# Patient Record
Sex: Male | Born: 1980 | Hispanic: No | Marital: Married | Smoking: Never smoker
Health system: Southern US, Community
[De-identification: ages and names within clinical notes are randomized; demographics above are authoritative.]

---

## 2017-10-23 ENCOUNTER — Emergency Department
Admission: EM | Admit: 2017-10-23 | Discharge: 2017-10-23 | Disposition: A | Discharge status: Home / Self Care | Payer: Self-pay | Attending: Emergency Medicine | Admitting: Emergency Medicine

## 2017-10-23 ENCOUNTER — Emergency Department: Payer: Self-pay

## 2017-10-23 DIAGNOSIS — N23 Unspecified renal colic: Secondary | ICD-10-CM

## 2017-10-23 DIAGNOSIS — N200 Calculus of kidney: Secondary | ICD-10-CM

## 2017-10-23 DIAGNOSIS — R109 Unspecified abdominal pain: Secondary | ICD-10-CM

## 2017-10-23 DIAGNOSIS — N132 Hydronephrosis with renal and ureteral calculous obstruction: Secondary | ICD-10-CM | POA: Insufficient documentation

## 2017-10-23 LAB — URINALYSIS, COMPLETE (UACMP) WITH MICROSCOPIC
BACTERIA UA: NONE SEEN
Bilirubin Urine: NEGATIVE
GLUCOSE, UA: NEGATIVE mg/dL
KETONES UR: NEGATIVE mg/dL
Leukocytes, UA: NEGATIVE
Nitrite: NEGATIVE
PROTEIN: 30 mg/dL — AB
Specific Gravity, Urine: 1.019 (ref 1.005–1.030)
pH: 6 (ref 5.0–8.0)

## 2017-10-23 LAB — COMPREHENSIVE METABOLIC PANEL
ALT: 12 U/L — AB (ref 17–63)
AST: 23 U/L (ref 15–41)
Albumin: 4.4 g/dL (ref 3.5–5.0)
Alkaline Phosphatase: 89 U/L (ref 38–126)
Anion gap: 8 (ref 5–15)
BUN: 12 mg/dL (ref 6–20)
CO2: 27 mmol/L (ref 22–32)
Calcium: 9.1 mg/dL (ref 8.9–10.3)
Chloride: 102 mmol/L (ref 101–111)
Creatinine, Ser: 1.38 mg/dL — ABNORMAL HIGH (ref 0.61–1.24)
GLUCOSE: 110 mg/dL — AB (ref 65–99)
Potassium: 3.5 mmol/L (ref 3.5–5.1)
Sodium: 137 mmol/L (ref 135–145)
Total Bilirubin: 0.7 mg/dL (ref 0.3–1.2)
Total Protein: 7.9 g/dL (ref 6.5–8.1)

## 2017-10-23 LAB — CBC
HEMATOCRIT: 47.1 % (ref 40.0–52.0)
HEMOGLOBIN: 16.1 g/dL (ref 13.0–18.0)
MCH: 28.7 pg (ref 26.0–34.0)
MCHC: 34.2 g/dL (ref 32.0–36.0)
MCV: 83.8 fL (ref 80.0–100.0)
Platelets: 192 10*3/uL (ref 150–440)
RBC: 5.62 MIL/uL (ref 4.40–5.90)
RDW: 14.3 % (ref 11.5–14.5)
WBC: 4.5 10*3/uL (ref 3.8–10.6)

## 2017-10-23 MED ORDER — ONDANSETRON HCL 4 MG/2ML IJ SOLN
4.0000 mg | Freq: Once | INTRAMUSCULAR | Status: AC
  Administered 2017-10-23: 4 mg via INTRAVENOUS

## 2017-10-23 MED ORDER — ONDANSETRON HCL 4 MG/2ML IJ SOLN
INTRAMUSCULAR | Status: AC
  Administered 2017-10-23: 4 mg via INTRAVENOUS
  Filled 2017-10-23: qty 2

## 2017-10-23 MED ORDER — ONDANSETRON 4 MG PO TBDP: ORAL_TABLET | ORAL | 0 refills | Status: AC

## 2017-10-23 MED ORDER — TAMSULOSIN HCL 0.4 MG PO CAPS: ORAL_CAPSULE | ORAL | 0 refills | Status: AC

## 2017-10-23 MED ORDER — FENTANYL CITRATE (PF) 100 MCG/2ML IJ SOLN
50.0000 ug | INTRAMUSCULAR | Status: DC | PRN
  Administered 2017-10-23: 50 ug via NASAL

## 2017-10-23 MED ORDER — KETOROLAC TROMETHAMINE 30 MG/ML IJ SOLN
15.0000 mg | Freq: Once | INTRAMUSCULAR | Status: AC
Start: 2017-10-23 — End: 2017-10-23
  Administered 2017-10-23: 15 mg via INTRAVENOUS
  Filled 2017-10-23: qty 1

## 2017-10-23 MED ORDER — MORPHINE SULFATE (PF) 4 MG/ML IV SOLN
INTRAVENOUS | Status: AC
  Administered 2017-10-23: 4 mg via INTRAVENOUS
  Filled 2017-10-23: qty 1

## 2017-10-23 MED ORDER — OXYCODONE-ACETAMINOPHEN 5-325 MG PO TABS
2.0000 | ORAL_TABLET | Freq: Once | ORAL | Status: AC
  Administered 2017-10-23: 2 via ORAL
  Filled 2017-10-23: qty 2

## 2017-10-23 MED ORDER — FENTANYL CITRATE (PF) 100 MCG/2ML IJ SOLN
INTRAMUSCULAR | Status: AC
  Filled 2017-10-23: qty 2

## 2017-10-23 MED ORDER — DOCUSATE SODIUM 100 MG PO CAPS: ORAL_CAPSULE | ORAL | 0 refills | Status: AC

## 2017-10-23 MED ORDER — MORPHINE SULFATE (PF) 4 MG/ML IV SOLN
4.0000 mg | Freq: Once | INTRAVENOUS | Status: AC
Start: 2017-10-23 — End: 2017-10-23
  Administered 2017-10-23: 4 mg via INTRAVENOUS

## 2017-10-23 MED ORDER — HYDROCODONE-ACETAMINOPHEN 5-325 MG PO TABS: 1.0000 | ORAL_TABLET | Freq: Four times a day (QID) | ORAL | 0 refills | Status: AC | PRN

## 2017-10-23 NOTE — ED Notes (Signed)
Pt discharged to home.  Pt taking uber to discharge.  Discharge instructions reviewed.  Verbalized understanding.  No questions or concerns at this time.  Teach back verified.  Pt in NAD.  No items left in ED.

## 2017-10-23 NOTE — ED Triage Notes (Signed)
Patient c/o sudden onset of left flank pain. Patient denies any injury. Patient denies hx of the same. Patient denies urinary changes.

## 2017-10-23 NOTE — ED Notes (Signed)
Spoke with Dr. York Cerise regarding pt's pain and vomiting due to pain.  VO given for morphine  IV and zofran  IV.

## 2017-10-23 NOTE — ED Notes (Signed)
Spoke with Dr Lamont Snowball regarding pt's presenting s/x's and c/o's; VORB for CT Renal to be entered by this RN.

## 2017-10-23 NOTE — Discharge Instructions (Signed)
You have been seen in the Emergency Department (ED) today for pain that we believe based on your workup, is caused by kidney stones.  As we have discussed, please drink plenty of fluids.  Please make a follow up appointment with the physician(s) listed elsewhere in this documentation. ° °You may take pain medication as needed but ONLY as prescribed.  Please also take your prescribed Flomax daily.  We also recommend that you take over-the-counter ibuprofen regularly according to label instructions over the next 5 days.  Take it with meals to minimize stomach discomfort. ° °Please see your doctor as soon as possible as stones may take 1-3 weeks to pass and you may require additional care or medications. ° °Do not drink alcohol, drive or participate in any other potentially dangerous activities while taking opiate pain medication as it may make you sleepy. Do not take this medication with any other sedating medications, either prescription or over-the-counter. If you were prescribed Percocet or Vicodin, do not take these with acetaminophen (Tylenol) as it is already contained within these medications. °  °Take Norco as needed for severe pain.  This medication is an opiate (or narcotic) pain medication and can be habit forming.  Use it as little as possible to achieve adequate pain control.  Do not use or use it with extreme caution if you have a history of opiate abuse or dependence.  If you are on a pain contract with your primary care doctor or a pain specialist, be sure to let them know you were prescribed this medication today from the [facility].  This medication is intended for your use only - do not give any to anyone else and keep it in a secure place where nobody else, especially children, have access to it.  It will also cause or worsen constipation, so you may want to consider taking an over-the-counter stool softener while you are taking this medication. ° °Return to the  Emergency Department (ED) or call your doctor if you have any worsening pain, fever, painful urination, are unable to urinate, or develop other symptoms that concern you. ° °

## 2017-10-23 NOTE — ED Provider Notes (Signed)
[facility] Emergency Department Provider Note  ____________________________________________   First MD Initiated Contact with Patient 10/23/17 [phone]     (approximate)  I have reviewed the triage vital signs and the nursing notes.   HISTORY  Chief Complaint Flank Pain    HPI Fred Rivera is a 37 y.o. 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  He presents tonight by private vehicle for evaluation of acute onset left flank pain with some associated nausea.  He describes the pain is sharp and stabbing and it started acutely about 24 hours or more from my evaluation of him.  He says that he was asleep at a truck stop when he woke up due to the pain.  It has been waxing and waning and occasionally will almost completely go away but then will come back very strong.  It is currently severe and nothing makes it better or worse.  He has vomited once and had some persistent nausea.  It is located in his left flank and is nonradiating.  He denies dysuria and hematuria.  He denies fever/chills, chest pain, shortness of breath, and anterior abdominal pain.  He does not have a history of kidney stones.  History reviewed. No pertinent past medical history.  There are no active problems to display for this patient.   History reviewed. No pertinent surgical history.  Prior to Admission medications   Medication Sig Start Date End Date Taking? Authorizing Provider  docusate sodium (COLACE) 100 MG capsule Take 1 tablet once or twice daily as needed for constipation while taking narcotic pain medicine 10/23/17   Loleta Rose, MD  HYDROcodone-acetaminophen (NORCO/VICODIN) 5-325 MG tablet Take 1-2 tablets by mouth every 6 (six) hours as needed for moderate pain. 10/23/17   Loleta Rose, MD  ondansetron (ZOFRAN ODT) 4 MG disintegrating tablet Allow 1-2 tablets to dissolve in your mouth every 8 hours as needed for nausea/vomiting  10/23/17   Loleta Rose, MD  tamsulosin [facility]) 0.4 MG CAPS capsule Take 1 tablet by mouth daily until you pass the kidney stone or no longer have symptoms 10/23/17   Loleta Rose, MD    Allergies Patient has no known allergies.  No family history on file.  Social History Social History   Tobacco Use  . Smoking status: Never Smoker  . Smokeless tobacco: Never Used  Substance Use Topics  . Alcohol use: Yes  . Drug use: Never    Review of Systems Constitutional: No fever/chills Eyes: No visual changes. ENT: No sore throat. Cardiovascular: Denies chest pain. Respiratory: Denies shortness of breath. Gastrointestinal: Left flank pain but no abdominal pain.  Nausea and one episode of vomiting.  No diarrhea.  No constipation. Genitourinary: Negative for dysuria, no gross hematuria Musculoskeletal: Negative for neck pain.  Left flank pain Integumentary: Negative for rash. Neurological: Negative for headaches, focal weakness or numbness.   ____________________________________________   PHYSICAL EXAM:  VITAL SIGNS: ED Triage Vitals [10/23/17 0120]  Enc Vitals Group     BP (!) 151/97     Pulse Rate 81     Resp 18     Temp 97.8 F (36.6 C)     Temp Source Oral     SpO2 100 %     Weight 84.4 kg (186 lb)     Height 1.676 m ( )     Head Circumference      Peak Flow      Pain  Score 10     Pain Loc      Pain Edu?      Excl. in GC?     Constitutional: Alert and oriented. Well appearing and in no acute distress but does appear uncomfortable Eyes: Conjunctivae are normal.  Head: Atraumatic. Nose: No congestion/rhinnorhea. Mouth/Throat: Mucous membranes are moist. Neck: No stridor.  No meningeal signs.   Cardiovascular: Normal rate, regular rhythm. Good peripheral circulation. Grossly normal heart sounds. Respiratory: Normal respiratory effort.  No retractions. Lungs CTAB. Gastrointestinal: Soft and nontender. No distention.  Musculoskeletal: Left CVA tenderness to  percussion.  No lower extremity tenderness nor edema. No gross deformities of extremities. Neurologic:  Normal speech and language. No gross focal neurologic deficits are appreciated.  Skin:  Skin is warm, dry and intact. No rash noted. Psychiatric: Mood and affect are normal. Speech and behavior are normal.  ____________________________________________   LABS (all labs ordered are listed, but only abnormal results are displayed)  Labs Reviewed  URINALYSIS, COMPLETE (UACMP) WITH MICROSCOPIC - Abnormal; Notable for the following components:      Result Value   Color, Urine YELLOW (*)    APPearance HAZY (*)    Hgb urine dipstick LARGE (*)    Protein, ur 30 (*)    RBC / HPF >50 (*)    All other components within normal limits  COMPREHENSIVE METABOLIC PANEL - Abnormal; Notable for the following components:   Glucose, Bld 110 (*)    Creatinine, Ser 1.38 (*)    ALT 12 (*)    All other components within normal limits  CBC   ____________________________________________  EKG  None - EKG not ordered by ED physician ____________________________________________  RADIOLOGY   ED MD interpretation: Radiologist reports that the patient has a 4 mm obstructing stone in the left distal ureter with some minimal hydronephrosis and some more intrarenal stones.  He also has some nonspecific hypodensities within the liver.  Official radiology report(s): Ct Renal Stone Study  Result Date: 10/23/2017 CLINICAL DATA:  Acute onset of left flank pain. EXAM: CT ABDOMEN AND PELVIS WITHOUT CONTRAST TECHNIQUE: Multidetector CT imaging of the abdomen and pelvis was performed following the standard protocol without IV contrast. COMPARISON:  None. FINDINGS: Lower chest: The visualized lung bases are grossly clear. The visualized portions of the mediastinum are unremarkable. Hepatobiliary: Nonspecific hypodensities within the liver measure up to 1.5 cm in size. The liver is otherwise unremarkable. The gallbladder  is grossly unremarkable in appearance. The common bile duct remains normal in caliber. Pancreas: The pancreas is within normal limits. Spleen: The spleen is unremarkable in appearance. Adrenals/Urinary Tract: The adrenal glands are unremarkable in appearance. There is a 4 mm stone at the distal left ureter, 9 cm above the left vesicoureteral junction. Minimal left-sided hydronephrosis is noted. Nonobstructing left renal stones measure up to 6 mm in size. The right kidney is unremarkable. No significant perinephric stranding is seen. Stomach/Bowel: The stomach is unremarkable in appearance. The small bowel is within normal limits. The appendix is normal in caliber, without evidence of appendicitis. The colon is unremarkable in appearance. Vascular/Lymphatic: The abdominal aorta is unremarkable in appearance. The inferior vena cava is grossly unremarkable. No retroperitoneal lymphadenopathy is seen. No pelvic sidewall lymphadenopathy is identified. Reproductive: The bladder is decompressed and not well assessed. The prostate remains normal in size. Other: No additional soft tissue abnormalities are seen. Musculoskeletal: No acute osseous abnormalities are identified. The visualized musculature is unremarkable in appearance. IMPRESSION: 1. 4 mm obstructing stone at the  distal left ureter, 9 cm above the left vesicoureteral junction. Minimal left-sided hydronephrosis noted. 2. Nonobstructing left renal stones measure up to 6 mm in size. 3. Nonspecific hypodensities within the liver measure up to 1.5 cm in size. Would correlate with LFTs, and consider further imaging if deemed clinically appropriate. Electronically Signed   By: Roanna Raider M.D.   On: 10/23/2017 02:06    ____________________________________________   PROCEDURES  Critical Care performed: No   Procedure(s) performed:   Procedures   ____________________________________________   INITIAL IMPRESSION / ASSESSMENT AND PLAN / ED COURSE  As  part of my medical decision making, I reviewed the following data within the electronic MEDICAL RECORD NUMBER Nursing notes reviewed and incorporated, Labs reviewed  and [place] Controlled Substance Database    Differential diagnosis includes, but is not limited to, ureterolithiasis/renal colic, pyelonephritis/UTI, musculoskeletal pain, less likely aortic dissection or renal ischemia.  The patient's lab work is generally reassuring; he has a slightly elevated creatinine but no prior values against which to compare and he is getting 1 L of normal saline.  His metabolic panel including LFTs is reassuring.  Normal CBC.  He does have some RBCs in his urine but no sign of infection and no bacteria.  CT renal stone protocol indicates a 4 mm stone in the left ureter with some mild hydronephrosis which is all consistent with his clinical picture.  He received morphine 4 mg IV and Zofran 4 mg IV which slightly improved his symptoms.  I will give Toradol 15 mg IV which will likely be quite effective with the renal colic as well as 2 Percocet tablets by mouth.  I will then reassess whether he would benefit from lidocaine.  I had my usual customary management recommendations and return precautions discussion with him, including the fact that he should not be using narcotics while he is driving back home to [place].  He states that he understands and agrees with the plan.  Clinical Course as of Oct 23 456  Thu Oct 23, 2017  0441 I reviewed the patient's prescription history over the last 24 months in the multi-state controlled substances database(s) that includes [place], [place], [place], [place], [place], [place], [place], [place], [place], [place], [place], [place], [place], [place], and [place].  The patient has filled no controlled substances during that time.    [CF]  0442 Patient's pain is well controlled and only mild.  Will discharge as per previously discussed plan.  I gave my  usual and customary return precautions.    [CF]    Clinical Course User Index [CF] Loleta Rose, MD    ____________________________________________  FINAL CLINICAL IMPRESSION(S) / ED DIAGNOSES  Final diagnoses:  Left flank pain  Ureteral colic  Kidney stone     MEDICATIONS GIVEN DURING THIS VISIT:  Medications  fentaNYL (SUBLIMAZE) injection 50 mcg (50 mcg Nasal Given 10/23/17 0129)  morphine 4 MG/ML injection 4 mg (4 mg Intravenous Given 10/23/17 0232)  ondansetron (ZOFRAN) injection 4 mg (4 mg Intravenous Given 10/23/17 0232)  ketorolac (TORADOL) 30 MG/ML injection 15 mg (15 mg Intravenous Given 10/23/17 0411)  oxyCODONE-acetaminophen (PERCOCET/ROXICET) 5-325 MG per tablet 2 tablet (2 tablets Oral Given 10/23/17 0411)     ED Discharge Orders        Ordered    HYDROcodone-acetaminophen (NORCO/VICODIN) 5-325 MG tablet  Every 6 hours PRN     10/23/17 0457    ondansetron (ZOFRAN ODT) 4 MG disintegrating tablet     10/23/17  0457    tamsulosin (FLOMAX) 0.4 MG CAPS capsule     10/23/17 0457    docusate sodium (COLACE) 100 MG capsule     10/23/17 0457       Note:  This document was prepared using Dragon voice recognition software and may include unintentional dictation errors.    Loleta Rose, MD 10/23/17 [phone]

## 2018-06-09 IMAGING — CT CT RENAL STONE PROTOCOL
2 of 4 series · 15 of 46 positions shown, 17 images · non-contrast
Comparison: None.

CLINICAL DATA: Acute onset of left flank pain.

EXAM:
CT ABDOMEN AND PELVIS WITHOUT CONTRAST
TECHNIQUE: Multidetector CT imaging of the abdomen and pelvis was performed
following the standard protocol without IV contrast.

[Series 2: stone full standard · axial · 0.70mm/px · z∈[-857,-417]mm · 12 of 97 slices shown, 14 images]
[im 5/97  soft-tissue]
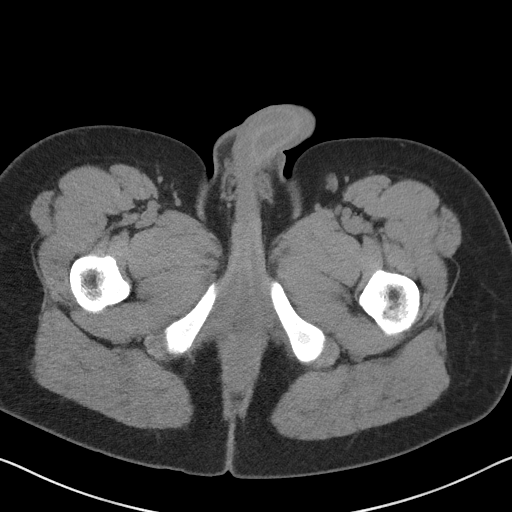
[im 5/97  bone]
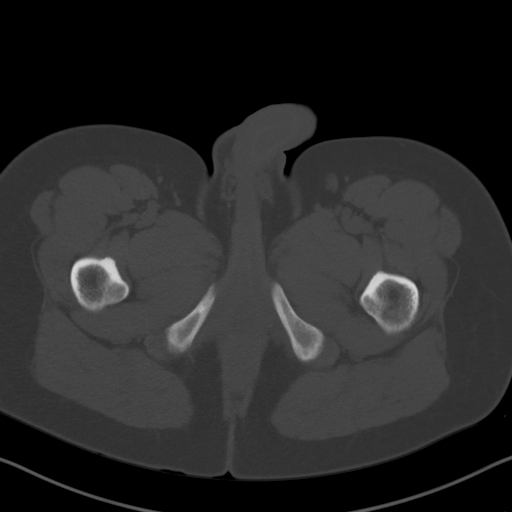
[im 13/97  soft-tissue]
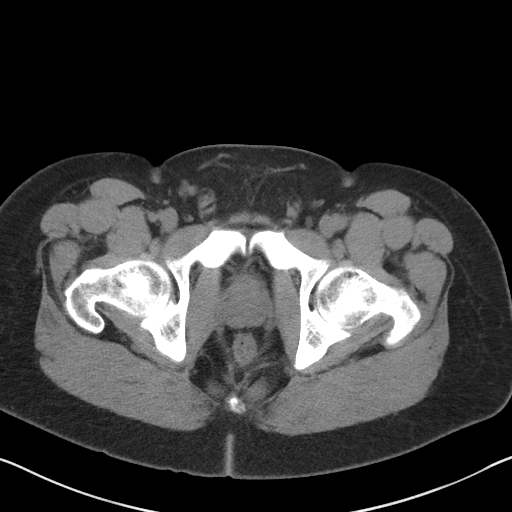
[im 21/97  soft-tissue]
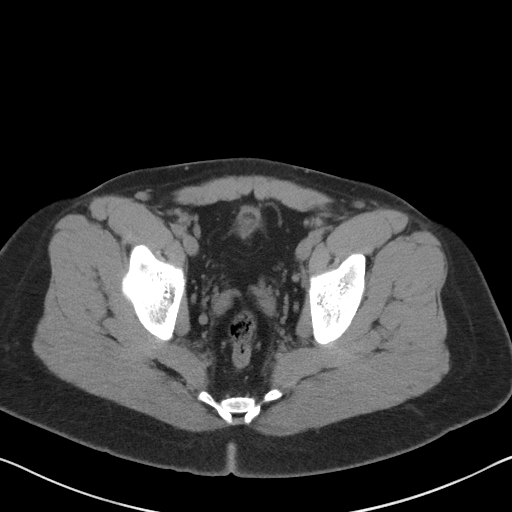
[im 29/97  soft-tissue]
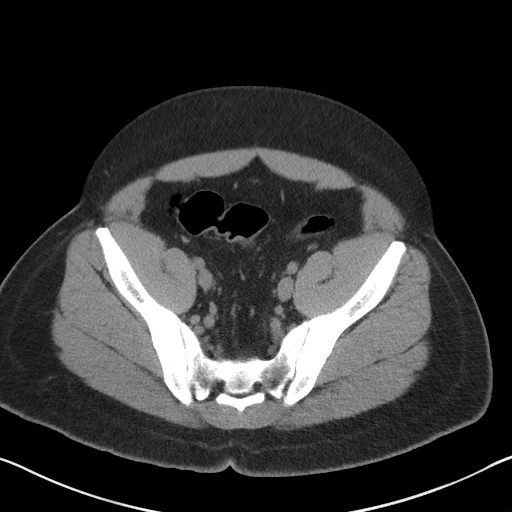
[im 37/97  soft-tissue]
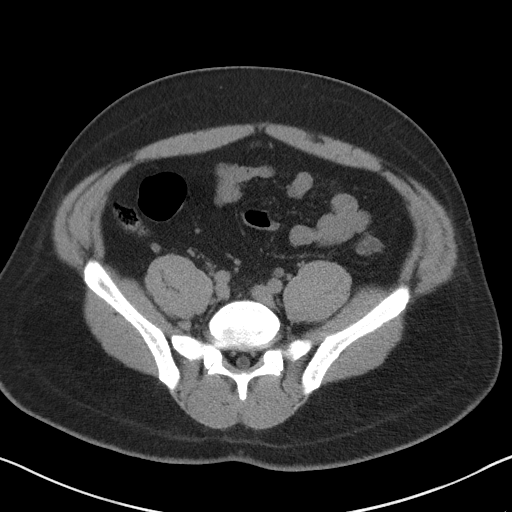
[im 45/97  soft-tissue]
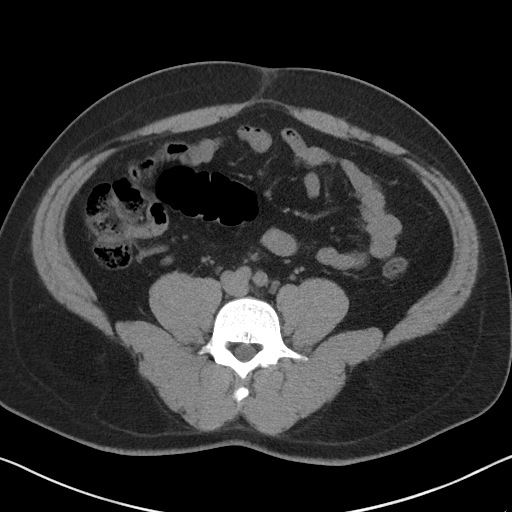
[im 53/97  soft-tissue]
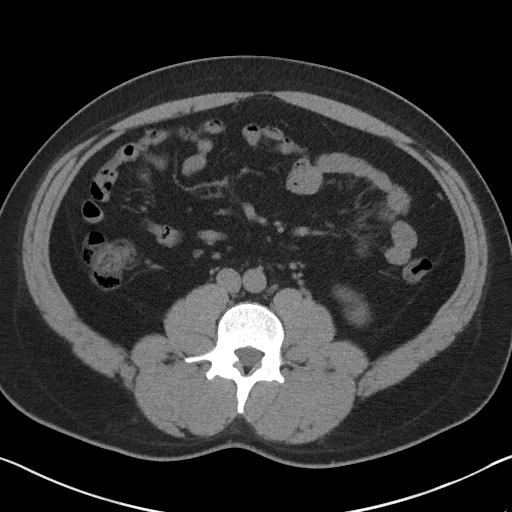
[im 61/97  soft-tissue]
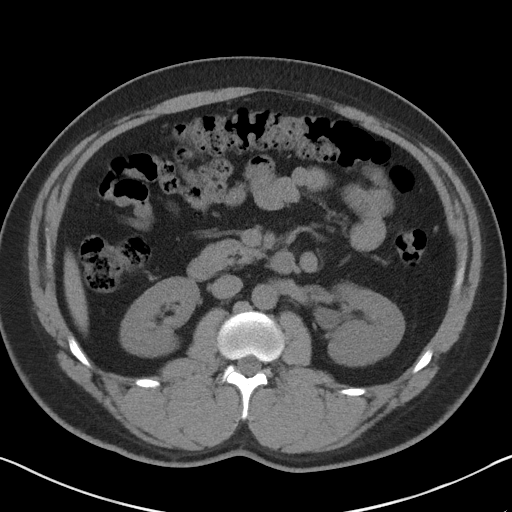
[im 69/97  soft-tissue]
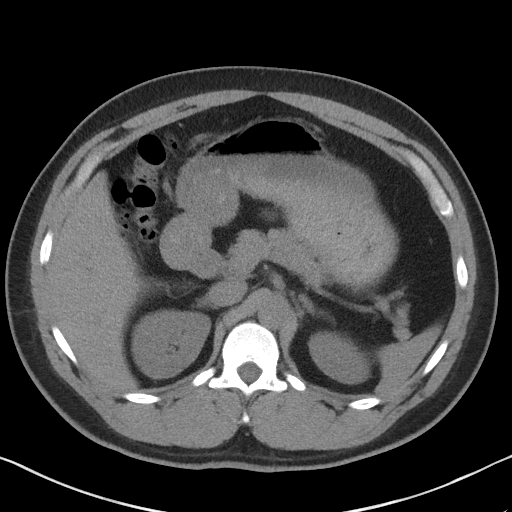
[im 69/97  bone]
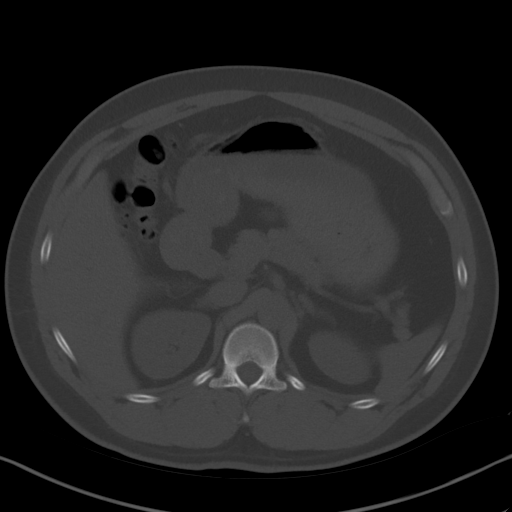
[im 77/97  soft-tissue]
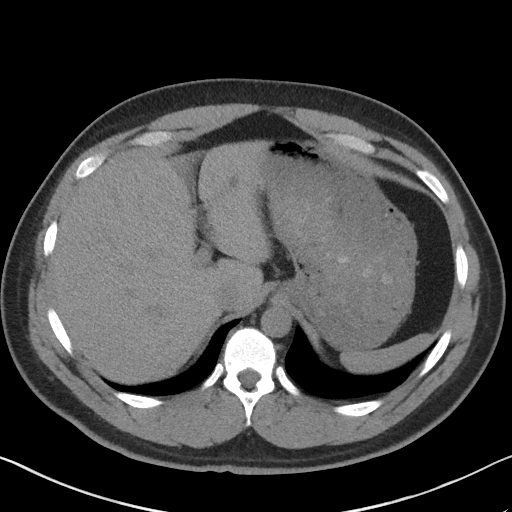
[im 85/97  soft-tissue]
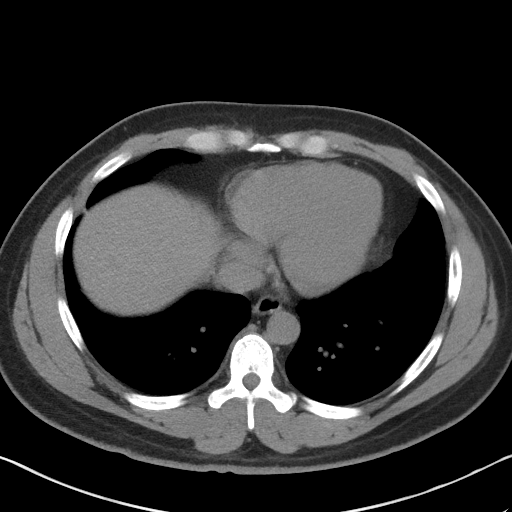
[im 93/97  soft-tissue]
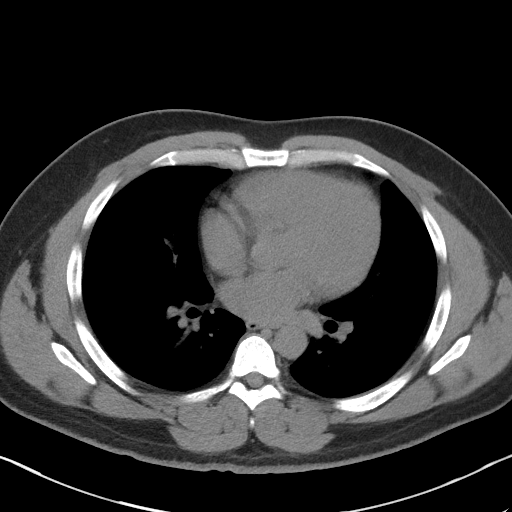

[Series 5: coronal · coronal · 0.72mm/px · 3 of 135 slices shown]
[im 45/135  soft-tissue]
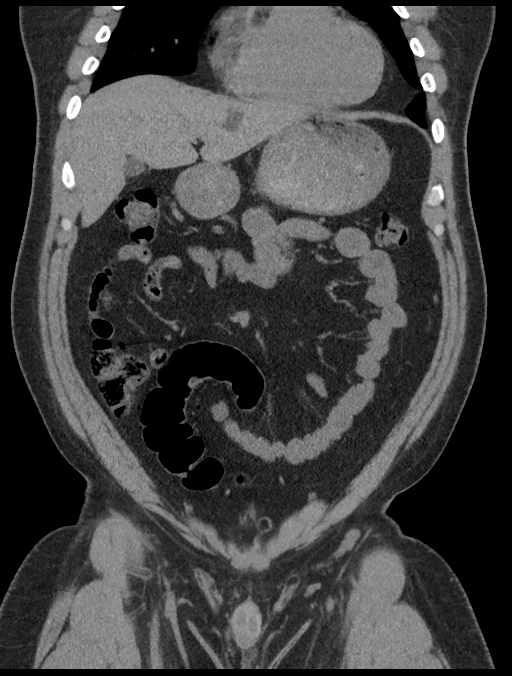
[im 60/135  soft-tissue]
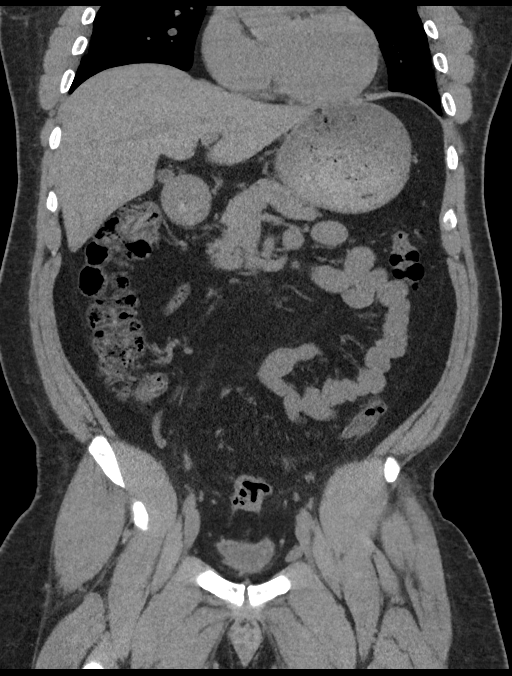
[im 75/135  soft-tissue]
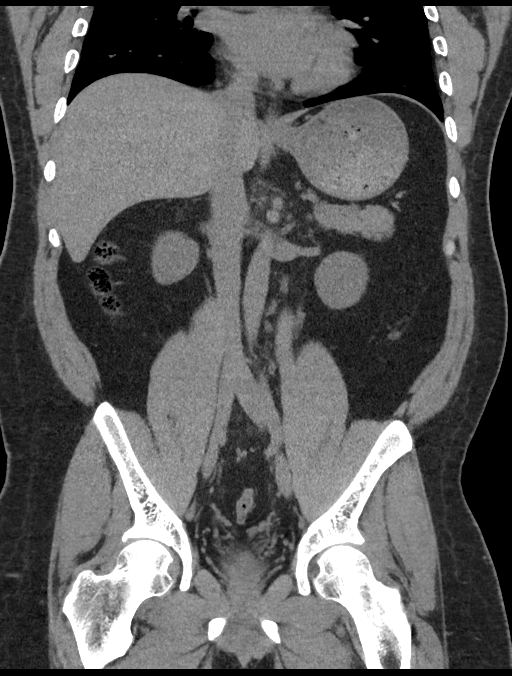

[15 of 46 positions shown; findings below may reference images not displayed]

FINDINGS: Lower chest: The visualized lung bases are grossly clear. The
visualized portions of the mediastinum are unremarkable.

Hepatobiliary: Nonspecific hypodensities within the liver measure up
to 1.5 cm in size. The liver is otherwise unremarkable. The
gallbladder is grossly unremarkable in appearance. The common bile
duct remains normal in caliber.

Pancreas: The pancreas is within normal limits.

Spleen: The spleen is unremarkable in appearance.

Adrenals/Urinary Tract: The adrenal glands are unremarkable in
appearance.

There is a 4 mm stone at the distal left ureter, 9 cm above the left
vesicoureteral junction. Minimal left-sided hydronephrosis is noted.
Nonobstructing left renal stones measure up to 6 mm in size. The
right kidney is unremarkable. No significant perinephric stranding
is seen.

Stomach/Bowel: The stomach is unremarkable in appearance. The small
bowel is within normal limits. The appendix is normal in caliber,
without evidence of appendicitis. The colon is unremarkable in
appearance.

Vascular/Lymphatic: The abdominal aorta is unremarkable in
appearance. The inferior vena cava is grossly unremarkable. No
retroperitoneal lymphadenopathy is seen. No pelvic sidewall
lymphadenopathy is identified.

Reproductive: The bladder is decompressed and not well assessed. The
prostate remains normal in size.

Other: No additional soft tissue abnormalities are seen.

Musculoskeletal: No acute osseous abnormalities are identified. The
visualized musculature is unremarkable in appearance.
IMPRESSION: 1. 4 mm obstructing stone at the distal left ureter, 9 cm above the
left vesicoureteral junction. Minimal left-sided hydronephrosis
noted.
2. Nonobstructing left renal stones measure up to 6 mm in size.
3. Nonspecific hypodensities within the liver measure up to 1.5 cm
in size. Would correlate with LFTs, and consider further imaging if
deemed clinically appropriate.
